# Patient Record
Sex: Female | Born: 2000 | Race: White | Hispanic: No | Marital: Single | Smoking: Never smoker
Health system: Southern US, Community
[De-identification: ages and names within clinical notes are randomized; demographics above are authoritative.]

## PROBLEM LIST (undated history)

## (undated) DIAGNOSIS — Z789 Other specified health status: Secondary | ICD-10-CM

## (undated) HISTORY — PX: NO PAST SURGERIES: SHX2092

## (undated) NOTE — *Deleted (*Deleted)
I value your feedback and entrusting us with your care. If you get a [facility] patient survey, I would appreciate you taking the time to let us know about your experience today. Thank you!  As of July 06, 2019, your lab results will be released to your MyChart immediately, before I even have a chance to see them. Please give me time to review them and contact you if there are any abnormalities. Thank you for your patience.  

---
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
## 2019-03-29 ENCOUNTER — Other Ambulatory Visit: Payer: Self-pay | Admitting: Ophthalmology

## 2019-03-29 DIAGNOSIS — I671 Cerebral aneurysm, nonruptured: Secondary | ICD-10-CM

## 2019-04-10 ENCOUNTER — Ambulatory Visit
Admission: RE | Admit: 2019-04-10 | Discharge: 2019-04-10 | Disposition: A | Discharge status: Home / Self Care | Payer: BLUE CROSS/BLUE SHIELD | Source: Ambulatory Visit | Attending: Ophthalmology | Admitting: Ophthalmology

## 2019-04-10 ENCOUNTER — Other Ambulatory Visit: Payer: Self-pay

## 2019-04-10 DIAGNOSIS — I671 Cerebral aneurysm, nonruptured: Secondary | ICD-10-CM | POA: Diagnosis not present

## 2019-04-10 MED ORDER — GADOBUTROL 1 MMOL/ML IV SOLN
6.0000 mL | Freq: Once | INTRAVENOUS | Status: AC | PRN
  Administered 2019-04-10: 13:00:00 6 mL via INTRAVENOUS

## 2019-04-13 ENCOUNTER — Other Ambulatory Visit: Payer: Self-pay

## 2019-04-13 DIAGNOSIS — Z20822 Contact with and (suspected) exposure to covid-19: Secondary | ICD-10-CM

## 2019-04-14 LAB — NOVEL CORONAVIRUS, NAA: SARS-CoV-2, NAA: NOT DETECTED

## 2019-04-20 ENCOUNTER — Other Ambulatory Visit: Payer: Self-pay

## 2019-04-20 DIAGNOSIS — Z20822 Contact with and (suspected) exposure to covid-19: Secondary | ICD-10-CM

## 2019-04-21 LAB — NOVEL CORONAVIRUS, NAA: SARS-CoV-2, NAA: NOT DETECTED

## 2019-05-02 ENCOUNTER — Other Ambulatory Visit
Admission: RE | Admit: 2019-05-02 | Discharge: 2019-05-02 | Disposition: A | Discharge status: Home / Self Care | Payer: BLUE CROSS/BLUE SHIELD | Source: Ambulatory Visit | Attending: Ophthalmology | Admitting: Ophthalmology

## 2019-05-02 ENCOUNTER — Other Ambulatory Visit: Payer: Self-pay | Admitting: Ophthalmology

## 2019-05-02 DIAGNOSIS — H348122 Central retinal vein occlusion, left eye, stable: Secondary | ICD-10-CM

## 2019-05-02 LAB — ANTITHROMBIN III: AntiThromb III Func: 104 % (ref 75–120)

## 2019-05-03 LAB — MISC LABCORP TEST (SEND OUT)
Labcorp test code: 86264
Labcorp test code: 117892

## 2019-05-03 LAB — PROTEIN S ACTIVITY: Protein S Activity: 76 % (ref 63–140)

## 2019-05-03 LAB — PROTEIN C ACTIVITY: Protein C Activity: 110 % (ref 73–180)

## 2019-05-03 LAB — HOMOCYSTEINE: Homocysteine: 6.1 umol/L (ref 0.0–14.5)

## 2019-05-04 ENCOUNTER — Encounter: Payer: Self-pay | Admitting: Advanced Practice Midwife

## 2019-05-04 ENCOUNTER — Ambulatory Visit (INDEPENDENT_AMBULATORY_CARE_PROVIDER_SITE_OTHER): Payer: BLUE CROSS/BLUE SHIELD | Admitting: Advanced Practice Midwife

## 2019-05-04 ENCOUNTER — Other Ambulatory Visit: Payer: Self-pay

## 2019-05-04 VITALS — BP 121/84 | HR 94 | Ht 68.0 in | Wt 152.0 lb

## 2019-05-04 DIAGNOSIS — Z3009 Encounter for other general counseling and advice on contraception: Secondary | ICD-10-CM | POA: Diagnosis not present

## 2019-05-04 MED ORDER — NORETHIN ACE-ETH ESTRAD-FE 1-20 MG-MCG PO TABS: 1.0000 | ORAL_TABLET | Freq: Every day | ORAL | 4 refills | Status: DC

## 2019-05-04 MED ORDER — NORETHIN ACE-ETH ESTRAD-FE 1-20 MG-MCG PO TABS: 1.0000 | ORAL_TABLET | Freq: Every day | ORAL | 11 refills | Status: DC

## 2019-05-04 NOTE — Progress Notes (Signed)
Patient ID: Kim Sandoval, female   DOB: 25-May-2001, 18 y.o.   MRN: [id number]  Reason for Consult: Advice Only (Discuss birthcontrol options)   Referred by Paulina Fusi, MD  Subjective:     HPI:   Kim Sandoval is a 18 y.o. female in the office to discuss birth control options. She has only used condoms and Plan B in the past. She admits monthly stress regarding the possibility of getting pregnant and would like to start hormonal birth control.  The patient is in school at [employer]. 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 She admits healthy lifestyle: diet, hydration, sleep. She is a non-smoker. She takes a multi vitamin and Claritin for allergies. She has had Gardasil vaccine x3. She does not have concern for STDs and declines testing.   Her periods are normally every month, last for 5-7 days and are of moderate flow.   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 So far the results have been normal. She does have headaches but does not know of any triggers- she denies menstrual headaches. She wears contact lenses/glasses. She denies a family history of bleeding disorders.  We discussed all of the birth control options in detail. She is most familiar with and comfortable taking the daily pill. Reviewed the importance of taking it every day around the same time, adjustment time for new hormones, and effectiveness lessens when taking antibiotics. We also discussed beginning PAP smears when she is 18 years old.   Past Medical History:  Diagnosis Date  . No pertinent past medical history    Family History  Problem Relation Age of Onset  . Asthma Brother    Past Surgical History:  Procedure Laterality Date  . NO PAST SURGERIES      Short Social History:  Social History   Tobacco Use  . Smoking status: Never Smoker  . Smokeless tobacco: Never Used  Substance Use Topics  . Alcohol use: Yes    Comment: occ     No Known Allergies  Current Outpatient Medications  Medication Sig Dispense Refill  . norethindrone-ethinyl estradiol (JUNEL FE 1/20) 1-20 MG-MCG tablet Take 1 tablet by mouth daily. 1 Package 11   No current facility-administered medications for this visit.     Review of Systems  Constitutional:  Constitutional negative. HENT: HENT negative.  Eyes: Positive for visual disturbance.   Respiratory: Respiratory negative.  Cardiovascular: Cardiovascular negative.  GI: Gastrointestinal negative.  GU: Genitourinary negative. Musculoskeletal: Musculoskeletal negative.  Skin: Skin negative.  Neurological: Neurological negative. Hematologic: Hematologic/lymphatic negative.  Psychiatric: Psychiatric negative.        Objective:  Objective   Vitals:   05/04/19 0827  BP: 121/84  Pulse: 94  Weight: 152 lb (68.9 kg)  Height: 5\' 8"  (1.727 m)   Body mass index is 23.11 kg/m. Constitutional: Well nourished, well developed female in no acute distress.  HEENT: normal Skin: Warm and dry.  Cardiovascular: Regular rate and rhythm.   Extremity: no edema  Respiratory:  Normal respiratory effort Neuro: DTRs 2+, Cranial nerves grossly intact Psych: Alert and Oriented x3. No memory deficits. Normal mood and affect.  MS: normal gait, normal bilateral lower extremity ROM/strength/stability.       Assessment/Plan:     18 year old G0 P0 female desiring birth control  Rx Junel Follow up PRN   [facility] 05/04/2019,  9:46 AM

## 2019-05-04 NOTE — Patient Instructions (Signed)
Bedsider.org is a good resource for information about all methods of birth control.

## 2019-05-05 ENCOUNTER — Ambulatory Visit
Admission: RE | Admit: 2019-05-05 | Discharge: 2019-05-05 | Disposition: A | Discharge status: Home / Self Care | Payer: BLUE CROSS/BLUE SHIELD | Source: Ambulatory Visit | Attending: Ophthalmology | Admitting: Ophthalmology

## 2019-05-05 ENCOUNTER — Other Ambulatory Visit: Payer: Self-pay

## 2019-05-05 DIAGNOSIS — H348122 Central retinal vein occlusion, left eye, stable: Secondary | ICD-10-CM

## 2019-05-08 LAB — FACTOR 5 LEIDEN

## 2019-05-08 LAB — PROTHROMBIN GENE MUTATION

## 2019-05-19 ENCOUNTER — Other Ambulatory Visit
Admission: RE | Admit: 2019-05-19 | Discharge: 2019-05-19 | Disposition: A | Discharge status: Home / Self Care | Payer: BLUE CROSS/BLUE SHIELD | Source: Ambulatory Visit | Attending: Ophthalmology | Admitting: Ophthalmology

## 2019-05-19 DIAGNOSIS — H35062 Retinal vasculitis, left eye: Secondary | ICD-10-CM | POA: Diagnosis present

## 2019-05-19 DIAGNOSIS — H3582 Retinal ischemia: Secondary | ICD-10-CM | POA: Insufficient documentation

## 2019-05-19 LAB — SEDIMENTATION RATE: Sed Rate: 8 mm/hr (ref 0–20)

## 2019-05-20 LAB — ANA: Anti Nuclear Antibody (ANA): NEGATIVE

## 2019-05-20 LAB — RHEUMATOID FACTOR: Rheumatoid fact SerPl-aCnc: <10 IU/mL (ref 0.0–13.9)

## 2019-05-20 LAB — RPR: RPR Ser Ql: NONREACTIVE

## 2019-05-21 LAB — ANGIOTENSIN CONVERTING ENZYME: Angiotensin-Converting Enzyme: 28 U/L (ref 14–82)

## 2019-05-22 LAB — TOXOPLASMA GONDII ANTIBODY, IGG: Toxoplasma IgG Ratio: <3 IU/mL (ref 0.0–7.1)

## 2019-05-22 LAB — PAN-ANCA
ANCA Proteinase 3: <3.5 U/mL (ref 0.0–3.5)
Atypical P-ANCA titer: <1:20 {titer}
C-ANCA: <1:20 {titer}
Myeloperoxidase Abs: <9 U/mL (ref 0.0–9.0)
P-ANCA: <1:20 {titer}

## 2019-05-23 LAB — QUANTIFERON-TB GOLD PLUS: QuantiFERON-TB Gold Plus: NEGATIVE

## 2019-05-23 LAB — QUANTIFERON-TB GOLD PLUS (RQFGPL)
QuantiFERON Mitogen Value: >10 IU/mL
QuantiFERON Nil Value: 0.02 IU/mL
QuantiFERON TB1 Ag Value: 0.02 IU/mL
QuantiFERON TB2 Ag Value: 0.02 IU/mL

## 2019-05-26 ENCOUNTER — Other Ambulatory Visit: Payer: Self-pay

## 2019-05-26 DIAGNOSIS — Z20822 Contact with and (suspected) exposure to covid-19: Secondary | ICD-10-CM

## 2019-05-28 LAB — NOVEL CORONAVIRUS, NAA: SARS-CoV-2, NAA: NOT DETECTED

## 2019-07-28 DIAGNOSIS — H47019 Ischemic optic neuropathy, unspecified eye: Secondary | ICD-10-CM

## 2019-07-28 HISTORY — DX: Ischemic optic neuropathy, unspecified eye: H47.019

## 2019-07-31 ENCOUNTER — Other Ambulatory Visit: Payer: Self-pay | Admitting: Family Medicine

## 2019-07-31 DIAGNOSIS — Z8616 Personal history of COVID-19: Secondary | ICD-10-CM

## 2019-07-31 DIAGNOSIS — U071 COVID-19: Secondary | ICD-10-CM

## 2019-07-31 DIAGNOSIS — I400 Infective myocarditis: Secondary | ICD-10-CM

## 2019-08-08 ENCOUNTER — Ambulatory Visit (INDEPENDENT_AMBULATORY_CARE_PROVIDER_SITE_OTHER): Payer: BLUE CROSS/BLUE SHIELD

## 2019-08-08 ENCOUNTER — Other Ambulatory Visit: Payer: Self-pay

## 2019-08-08 ENCOUNTER — Other Ambulatory Visit
Admission: RE | Admit: 2019-08-08 | Discharge: 2019-08-08 | Disposition: A | Discharge status: Home / Self Care | Payer: BLUE CROSS/BLUE SHIELD | Source: Ambulatory Visit | Attending: Family Medicine | Admitting: Family Medicine

## 2019-08-08 DIAGNOSIS — U071 COVID-19: Secondary | ICD-10-CM

## 2019-08-08 DIAGNOSIS — Z8616 Personal history of COVID-19: Secondary | ICD-10-CM

## 2019-08-08 DIAGNOSIS — I400 Infective myocarditis: Secondary | ICD-10-CM

## 2019-08-08 LAB — TROPONIN I (HIGH SENSITIVITY): Troponin I (High Sensitivity): <2 ng/L (ref <18)

## 2019-09-05 DIAGNOSIS — H47012 Ischemic optic neuropathy, left eye: Secondary | ICD-10-CM | POA: Insufficient documentation

## 2019-09-05 DIAGNOSIS — G43019 Migraine without aura, intractable, without status migrainosus: Secondary | ICD-10-CM | POA: Insufficient documentation

## 2019-11-09 ENCOUNTER — Other Ambulatory Visit: Payer: Self-pay

## 2019-11-09 ENCOUNTER — Ambulatory Visit: Payer: Self-pay | Admitting: Physician Assistant

## 2019-11-09 ENCOUNTER — Encounter: Payer: Self-pay | Admitting: Physician Assistant

## 2019-11-09 DIAGNOSIS — Z113 Encounter for screening for infections with a predominantly sexual mode of transmission: Secondary | ICD-10-CM

## 2019-11-09 LAB — WET PREP FOR TRICH, YEAST, CLUE
Trichomonas Exam: NEGATIVE
Yeast Exam: NEGATIVE

## 2019-11-09 NOTE — Progress Notes (Signed)
Patient here for STD testing today. Taking OC for BCM.Marland KitchenBurt Knack, RN

## 2019-11-09 NOTE — Progress Notes (Signed)
Wet mount reviewed, no treatment indicated..Insiya Oshea Brewer-Jensen, RN 

## 2019-11-09 NOTE — Progress Notes (Signed)
  [facility] Department STI clinic/screening visit  Subjective:  Kim Sandoval is a 19 y.o. female being seen today for an STI screening visit. The patient reports they do have symptoms.  Patient reports that they do not desire a pregnancy in the next year.   They reported they are not interested in discussing contraception today.  Patient's last menstrual period was 10/17/2019 (exact date).   Patient has the following medical conditions:  There are no problems to display for this patient.   Chief Complaint  Patient presents with  . Exposure to STD    19 yo woman here for STI testing. On OCPs for contraception.   Patient reports she went [facility] due to urinary freq and just started on an oral antibiotic she does not know the name of. She has had 2 doses.  See flowsheet for further details and programmatic requirements.    The following portions of the patient's history were reviewed and updated as appropriate: allergies, current medications, past medical history, past social history, past surgical history and problem list.  Objective:  There were no vitals filed for this visit.  Physical Exam Constitutional:      Appearance: Normal appearance.  HENT:     Mouth/Throat:     Mouth: Mucous membranes are moist.     Pharynx: Oropharynx is clear. No oropharyngeal exudate or posterior oropharyngeal erythema.  Neck:     Vascular: No carotid bruit.  Pulmonary:     Effort: Pulmonary effort is normal.  Abdominal:     Palpations: Abdomen is soft.     Tenderness: There is no abdominal tenderness. There is no guarding.  Musculoskeletal:     Cervical back: Normal range of motion.  Lymphadenopathy:     Cervical: No cervical adenopathy.  Skin:    General: Skin is warm and dry.     Findings: No erythema.  Neurological:     Mental Status: She is alert and oriented to person, place, and time.  Psychiatric:        Behavior: Behavior normal.      Thought Content: Thought content normal.        Judgment: Judgment normal.      Assessment and Plan:  Kim Sandoval is a 19 y.o. female presenting to the [facility] Department for STI screening  1. Routine screening for STI (sexually transmitted infection) Treat wet prep prn per standing orders. - HIV [place] [facility] - Syphilis Serology, [facility] - Chlamydia/Gonorrhea  Lab - WET PREP FOR TRICH, YEAST, CLUE - Gonococcus culture - Gonococcus culture     Return if symptoms worsen or fail to improve, or for repeat STI screening..  No future appointments.  Landry Dyke, PA-C

## 2019-11-14 LAB — GONOCOCCUS CULTURE

## 2019-12-08 ENCOUNTER — Encounter: Payer: Self-pay | Admitting: Advanced Practice Midwife

## 2019-12-08 ENCOUNTER — Ambulatory Visit (INDEPENDENT_AMBULATORY_CARE_PROVIDER_SITE_OTHER): Payer: BLUE CROSS/BLUE SHIELD | Admitting: Advanced Practice Midwife

## 2019-12-08 ENCOUNTER — Other Ambulatory Visit: Payer: Self-pay

## 2019-12-08 VITALS — BP 100/60 | Ht 68.0 in | Wt 150.0 lb

## 2019-12-08 DIAGNOSIS — Z30016 Encounter for initial prescription of transdermal patch hormonal contraceptive device: Secondary | ICD-10-CM

## 2019-12-08 MED ORDER — NORELGESTROMIN-ETH ESTRADIOL 150-35 MCG/24HR TD PTWK: 1.0000 | MEDICATED_PATCH | TRANSDERMAL | 4 refills | Status: DC

## 2019-12-11 ENCOUNTER — Encounter: Payer: Self-pay | Admitting: Advanced Practice Midwife

## 2019-12-11 NOTE — Progress Notes (Signed)
   Patient ID: Kim Sandoval, female   DOB: 2001/05/02, 19 y.o.   MRN: [id number]  Reason for Consult: Contraception   Subjective:  Date of Service: 12/08/2019  HPI:  Kim Sandoval is a 19 y.o. female being seen because she wants to change birth control method. She has been on Junel OCP since last October when birth control was initiated. At this time she would like to switch to a non-daily method of control. She is most interested in the patch and understands it must be changed weekly for ideal use. She understands there is an adjustment time when changing hormones. She is due for an annual in 1 year.  She is a non-smoker and has no other concerns at this time.   Past Medical History:  Diagnosis Date  . No pertinent past medical history    Family History  Problem Relation Age of Onset  . Asthma Brother    Past Surgical History:  Procedure Laterality Date  . NO PAST SURGERIES      Short Social History:  Social History   Tobacco Use  . Smoking status: Never Smoker  . Smokeless tobacco: Never Used  Substance Use Topics  . Alcohol use: Yes    Comment: occ    Allergies  Allergen Reactions  . Penicillins Other (See Comments)    Unsure reaction  . Latex Rash    Current Outpatient Medications  Medication Sig Dispense Refill  . norelgestromin-ethinyl estradiol (ORTHO EVRA) 150-35 MCG/24HR transdermal patch Place 1 patch onto the skin once a week. 9 patch 4   No current facility-administered medications for this visit.    Review of Systems  Constitutional: Negative for chills and fever.  HENT: Negative for congestion, ear discharge, ear pain, hearing loss, sinus pain and sore throat.   Eyes: Negative for blurred vision and double vision.  Respiratory: Negative for cough, shortness of breath and wheezing.   Cardiovascular: Negative for chest pain, palpitations and leg swelling.  Gastrointestinal: Negative for abdominal pain, blood in stool, constipation, diarrhea,  heartburn, melena, nausea and vomiting.  Genitourinary: Negative for dysuria, flank pain, frequency, hematuria and urgency.  Musculoskeletal: Negative for back pain, joint pain and myalgias.  Skin: Negative for itching and rash.  Neurological: Negative for dizziness, tingling, tremors, sensory change, speech change, focal weakness, seizures, loss of consciousness, weakness and headaches.  Endo/Heme/Allergies: Negative for environmental allergies. Does not bruise/bleed easily.  Psychiatric/Behavioral: Negative for depression, hallucinations, memory loss, substance abuse and suicidal ideas. The patient is not nervous/anxious and does not have insomnia.        Objective:  Objective   Vitals:   12/08/19 1543  BP: 100/60  Weight: 150 lb (68 kg)  Height: 5\' 8"  (1.727 m)   Body mass index is 22.81 kg/m. Constitutional: Well nourished, well developed female in no acute distress.  HEENT: normal Skin: Warm and dry.  Cardiovascular: Regular rate and rhythm.   Extremity: no edema  Respiratory: Clear to auscultation bilateral. Normal respiratory effort Neuro: DTRs 2+, Cranial nerves grossly intact Psych: Alert and Oriented x3. No memory deficits. Normal mood and affect.  MS: normal gait, normal strength and range of motion    Assessment/Plan:   19 y.o. female with change of birth control method  Discontinue Junel OCP Rx Ortho-Evra weekly birth control patch Return to clinic PRN and for annual exam in 1 year   12 CNM [facility]

## 2020-01-03 NOTE — Telephone Encounter (Signed)
Sergio returned call. She is currently in [place] and was seen by a provider there for yeast infection and given Diflucan to take every 3 days x 2 doses. The external swelling has resolved, still having a little itching. Advised can get Lotrimin over the counter for external use or call her provider in [place] for a RX. Was asking why the yeast infection developed. Has not been on any antibiotics or different soap or had any different laundry products. May just be the heat, sweating etc. To check partner for any symptoms of fungal infection.  Farrel Conners, CNM

## 2020-04-09 ENCOUNTER — Other Ambulatory Visit: Payer: Self-pay

## 2020-04-09 DIAGNOSIS — Z30016 Encounter for initial prescription of transdermal patch hormonal contraceptive device: Secondary | ICD-10-CM

## 2020-04-09 MED ORDER — NORELGESTROMIN-ETH ESTRADIOL 150-35 MCG/24HR TD PTWK: 1.0000 | MEDICATED_PATCH | TRANSDERMAL | 4 refills | Status: DC

## 2020-04-22 ENCOUNTER — Ambulatory Visit: Payer: BLUE CROSS/BLUE SHIELD | Admitting: Advanced Practice Midwife

## 2020-04-26 ENCOUNTER — Ambulatory Visit (INDEPENDENT_AMBULATORY_CARE_PROVIDER_SITE_OTHER): Payer: BLUE CROSS/BLUE SHIELD | Admitting: Advanced Practice Midwife

## 2020-04-26 ENCOUNTER — Other Ambulatory Visit: Payer: Self-pay

## 2020-04-26 ENCOUNTER — Encounter: Payer: Self-pay | Admitting: Advanced Practice Midwife

## 2020-04-26 VITALS — BP 120/60 | Ht 67.0 in | Wt 156.0 lb

## 2020-04-26 DIAGNOSIS — Z3009 Encounter for other general counseling and advice on contraception: Secondary | ICD-10-CM | POA: Diagnosis not present

## 2020-04-29 NOTE — Progress Notes (Signed)
   Patient ID: Kim Sandoval, female   DOB: 2001-01-30, 19 y.o.   MRN: [id number]  Reason for Consult: Contraception (interested in non-hormonal BC)   Referred by Dr Micael Hampshire, [facility]  Subjective:  Date of Service: 04/26/2020  HPI:   Kim Sandoval is a 19 y.o. female the patient is being seen on the advice of her Opthalmologist at [facility] regarding switching to non-hormonal birth control. 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 Her doctor would like the patient to reduce her risk of blood clots by switching to non-estrogen birth control. The patient has done some research on Paragard IUD and is most interested in that option. We discussed all progesterone only options and non-hormonal options. She has no other concerns at this time.  Past Medical History:  Diagnosis Date  . Ischemic optic neuropathy 2021  . No pertinent past medical history    Family History  Problem Relation Age of Onset  . Asthma Brother    Past Surgical History:  Procedure Laterality Date  . NO PAST SURGERIES      Short Social History:  Social History   Tobacco Use  . Smoking status: Never Smoker  . Smokeless tobacco: Never Used  Substance Use Topics  . Alcohol use: Yes    Comment: occ    Allergies  Allergen Reactions  . Latex Rash    Current Outpatient Medications  Medication Sig Dispense Refill  . norelgestromin-ethinyl estradiol (ORTHO EVRA) 150-35 MCG/24HR transdermal patch Place 1 patch onto the skin once a week. 9 patch 4   No current facility-administered medications for this visit.    Review of Systems  Constitutional: Negative for chills and fever.  HENT: Negative for congestion, ear discharge, ear pain, hearing loss, sinus pain and sore throat.   Eyes: Negative for blurred vision and double vision.  Respiratory: Negative for cough, shortness of breath and wheezing.   Cardiovascular: Negative for chest pain, palpitations and leg  swelling.  Gastrointestinal: Negative for abdominal pain, blood in stool, constipation, diarrhea, heartburn, melena, nausea and vomiting.  Genitourinary: Negative for dysuria, flank pain, frequency, hematuria and urgency.  Musculoskeletal: Negative for back pain, joint pain and myalgias.  Skin: Negative for itching and rash.  Neurological: Negative for dizziness, tingling, tremors, sensory change, speech change, focal weakness, seizures, loss of consciousness, weakness and headaches.  Endo/Heme/Allergies: Negative for environmental allergies. Does not bruise/bleed easily.  Psychiatric/Behavioral: Negative for depression, hallucinations, memory loss, substance abuse and suicidal ideas. The patient is not nervous/anxious and does not have insomnia.         Objective:  Objective   Vitals:   04/26/20 1622  BP: 120/60  Weight: 156 lb (70.8 kg)  Height: 5\' 7"  (1.702 m)   Body mass index is 24.43 kg/m.  Constitutional: Well nourished, well developed female in no acute distress.  HEENT: normal Skin: Warm and dry.  Respiratory: Normal respiratory effort Neuro: DTRs 2+, Cranial nerves grossly intact Psych: Alert and Oriented x3. No memory deficits. Normal mood and affect.  MS: normal gait, normal bilateral lower extremity ROM/strength/stability.  Limited physical exam, purpose of visit is primarily counseling   Assessment/Plan:     19 y.o. G0 P0 female with birth control counseling visit  Call when on period for Paragard Copper IUD insertion appointment   26 CNM [facility] 04/29/2020, 5:51 PM

## 2020-05-01 ENCOUNTER — Telehealth: Payer: Self-pay | Admitting: Obstetrics and Gynecology

## 2020-05-01 NOTE — Patient Instructions (Signed)
I value your feedback and entrusting us with your care. If you get a [facility] patient survey, I would appreciate you taking the time to let us know about your experience today. Thank you!  As of July 06, 2019, your lab results will be released to your MyChart immediately, before I even have a chance to see them. Please give me time to review them and contact you if there are any abnormalities. Thank you for your patience.   [facility] [phone]  Instructions after IUD insertion  Most women experience no significant problems after insertion of an IUD, however minor cramping and spotting for a few days is common. Cramps may be treated with ibuprofen 800mg every 8 hours or Tylenol 650 mg every 4 hours. Contact [facility] immediately if you experience any of the following symptoms during the next week: temperature >99.6 degrees, worsening pelvic pain, abdominal pain, fainting, unusually heavy vaginal bleeding, foul vaginal discharge, or if you think you have expelled the IUD.  Nothing inserted in the vagina for 48 hours. You will be scheduled for a follow up visit in approximately four weeks.  You should check monthly to be sure you can feel the IUD strings in the upper vagina. If you are having a monthly period, try to check after each period. If you cannot feel the IUD strings,  contact [facility] immediately so we can do an exam to determine if the IUD has been expelled.   Please use backup protection until we can confirm the IUD is in place.  Call [facility] if you are exposed to or diagnosed with a sexually transmitted infection, as we will need to discuss whether it is safe for you to continue using an IUD.   

## 2020-05-01 NOTE — Telephone Encounter (Signed)
Patient is scheduled for 05/02/20 with ABC at 8:50 for paraguard placement

## 2020-05-01 NOTE — Progress Notes (Signed)
   Chief Complaint  Patient presents with  . Contraception    Paragard insertion     IUD PROCEDURE NOTE:  Kim Sandoval is a 19 y.o. 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 Ophthalmology prefers pt be in non-estrogen [facility]. She and Higinio Plan, CNM discussed options and pt interested in Paragard.  Neg STD testing 4/21, has new partner since  BP 104/70   Ht 5\' 7"  (1.702 m)   Wt 159 lb (72.1 kg)   LMP 05/01/2020   BMI 24.90 kg/m   IUD Insertion Procedure Note Patient identified, informed consent performed, consent signed.   Discussed risks of irregular bleeding, cramping, infection, malpositioning or misplacement of the IUD outside the uterus which may require further procedure such as laparoscopy, risk of failure <1%. Time out was performed.    Speculum placed in the vagina.  Cervix visualized.  Cleaned with Betadine x 2.  Grasped anteriorly with a single tooth tenaculum.  Uterus sounded to 7.5 cm.   IUD placed per manufacturer's recommendations.  Strings trimmed to 3 cm. Tenaculum was removed, good hemostasis noted.  Patient tolerated procedure well.   ASSESSMENT:  Encounter for IUD insertion - Plan: paragard intrauterine copper IUD  Screening for STD (sexually transmitted disease) - Plan: Cervicovaginal ancillary only   Meds ordered this encounter  Medications  . paragard intrauterine copper IUD     Plan:  Patient was given post-procedure instructions.  She was advised to have backup contraception for one week.   Call if you are having increasing pain, cramps or bleeding or if you have a fever greater than 100.4 degrees F., shaking chills, nausea or vomiting. Patient was also asked to check IUD strings periodically and follow up in 4 weeks for IUD check.  Return in about 4 weeks (around 05/30/2020) for IUD f/u.  Shaakira Borrero B. Autumne Kallio, PA-C 05/02/2020 9:12 AM

## 2020-05-02 ENCOUNTER — Other Ambulatory Visit: Payer: Self-pay

## 2020-05-02 ENCOUNTER — Other Ambulatory Visit (HOSPITAL_COMMUNITY)
Admission: RE | Admit: 2020-05-02 | Discharge: 2020-05-02 | Disposition: A | Discharge status: Home / Self Care | Payer: BLUE CROSS/BLUE SHIELD | Source: Ambulatory Visit | Attending: Obstetrics and Gynecology | Admitting: Obstetrics and Gynecology

## 2020-05-02 ENCOUNTER — Ambulatory Visit (INDEPENDENT_AMBULATORY_CARE_PROVIDER_SITE_OTHER): Payer: BLUE CROSS/BLUE SHIELD | Admitting: Obstetrics and Gynecology

## 2020-05-02 ENCOUNTER — Encounter: Payer: Self-pay | Admitting: Obstetrics and Gynecology

## 2020-05-02 VITALS — BP 104/70 | Ht 67.0 in | Wt 159.0 lb

## 2020-05-02 DIAGNOSIS — Z30430 Encounter for insertion of intrauterine contraceptive device: Secondary | ICD-10-CM

## 2020-05-02 DIAGNOSIS — Z113 Encounter for screening for infections with a predominantly sexual mode of transmission: Secondary | ICD-10-CM

## 2020-05-02 MED ORDER — PARAGARD INTRAUTERINE COPPER IU IUD: INTRAUTERINE_SYSTEM | Freq: Once | INTRAUTERINE | Status: AC

## 2020-05-02 NOTE — Telephone Encounter (Signed)
Noted. Paragard provided for this patient.

## 2020-05-03 LAB — CERVICOVAGINAL ANCILLARY ONLY
Chlamydia: NEGATIVE
Comment: NEGATIVE
Comment: NORMAL
Neisseria Gonorrhea: NEGATIVE

## 2020-05-29 NOTE — Progress Notes (Deleted)
   No chief complaint on file.    History of Present Illness:  Kim Sandoval is a 19 y.o. that had a Paragard IUD placed approximately 1 month ago. Since that time, she denies dyspareunia, pelvic pain, non-menstrual bleeding, vaginal d/c, heavy bleeding.   Review of Systems  Physical Exam:  LMP 05/01/2020  There is no height or weight on file to calculate BMI.  Pelvic exam:  Two IUD strings {DESC; PRESENT/ABSENT:17923::"present"} seen coming from the cervical os. EGBUS, vaginal vault and cervix: within normal limits   Assessment:   No diagnosis found.  IUD strings present in proper location; pt doing well  Plan: F/u if any signs of infection or can no longer feel the strings.   Jigar Zielke B. Conni Knighton, PA-C 05/29/2020 4:57 PM

## 2020-05-30 ENCOUNTER — Ambulatory Visit: Payer: BLUE CROSS/BLUE SHIELD | Admitting: Obstetrics and Gynecology

## 2020-05-30 DIAGNOSIS — Z30431 Encounter for routine checking of intrauterine contraceptive device: Secondary | ICD-10-CM

## 2020-06-06 IMAGING — MR MR HEAD WO/W CM
14 series · 43 of 48 positions shown · IV contrast (6ml Gadavist)
Comparison: None.

CLINICAL DATA: Carotid-cavernous fistula. Visual problems since the
end [DATE]. History of head trauma with concussion 2-3 years ago.

EXAM:
MRI HEAD AND ORBITS WITHOUT AND WITH CONTRAST
TECHNIQUE: Multiplanar, multiecho pulse sequences of the brain and surrounding
structures were obtained without and with intravenous contrast.
Multiplanar, multiecho pulse sequences of the orbits and surrounding
structures were obtained including fat saturation techniques, before
and after intravenous contrast administration.
CONTRAST:  6mL GADAVIST GADOBUTROL 1 MMOL/ML IV SOLN

[Series 5: T1 · sagittal · 3.0mm · 0.38mm/px · 1 of 35 slices shown (1 of 2)]
[im 1/35]
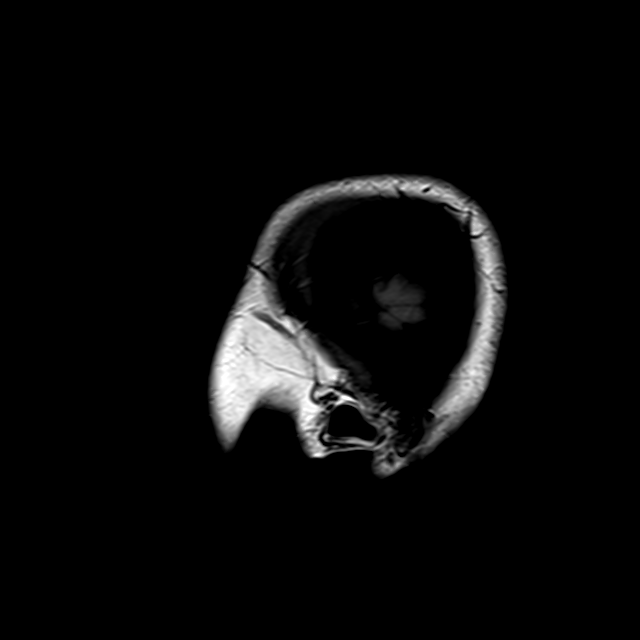

[Series 6: T2 · axial · 5.0mm · 0.53mm/px · 1 of 25 slices shown]
[im 1/25]
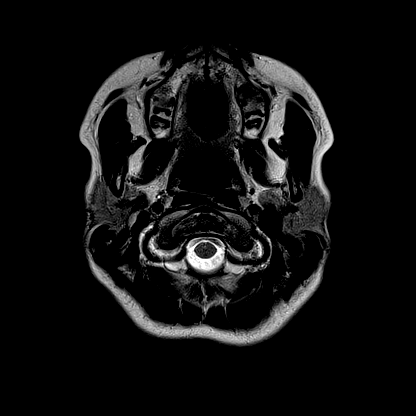

[Series 7: ax dwi_tracew · axial · 3.0mm · 0.60mm/px · z∈[-102,+51]mm · 3 of 48 slices shown]
[im 1/48]
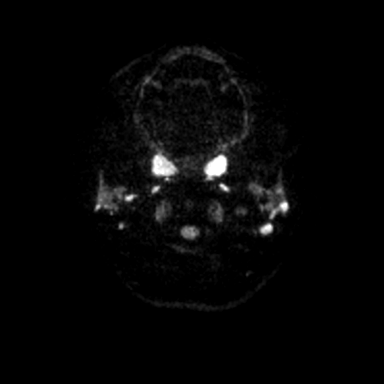
[im 24/48]
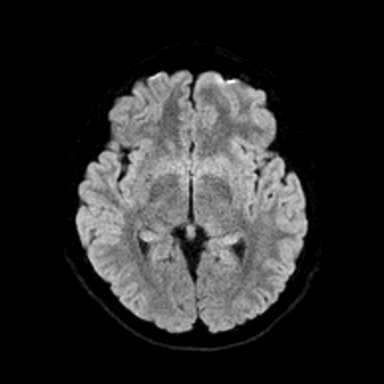
[im 48/48]
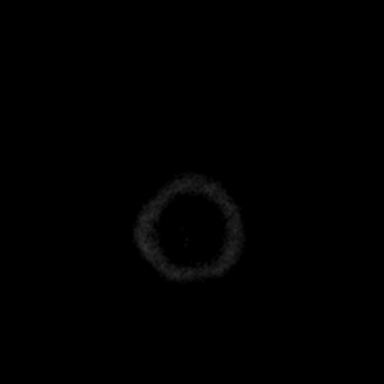

[Series 8: ax dwi_adc · axial · 3.0mm · 0.60mm/px · z∈[-102,+51]mm · 3 of 48 slices shown]
[im 1/48]
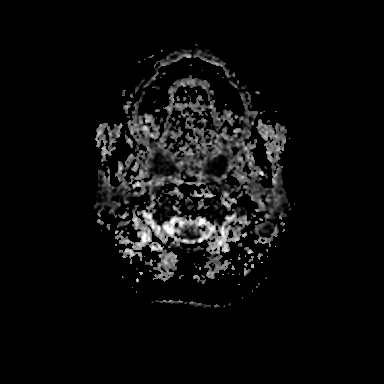
[im 24/48]
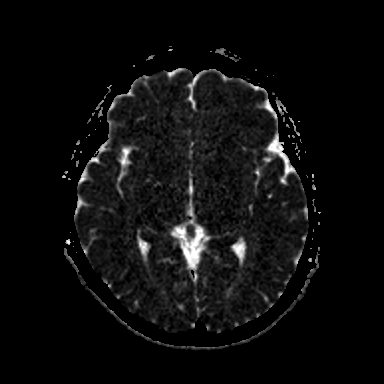
[im 48/48]
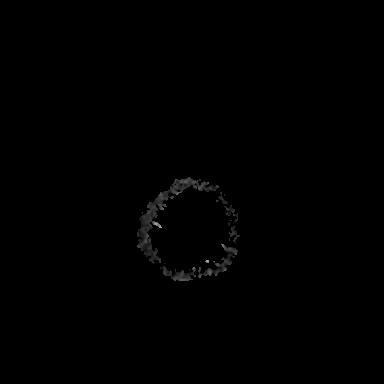

[Series 9: cor dwi_tracew · coronal · 5.0mm · 0.60mm/px · 2 of 38 slices shown]
[im 1/38]
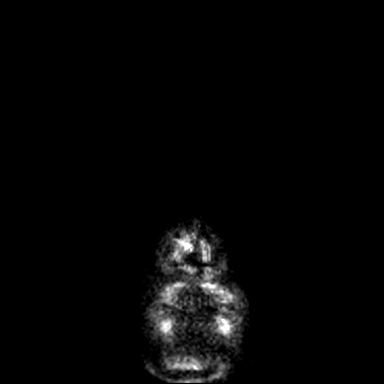
[im 38/38]
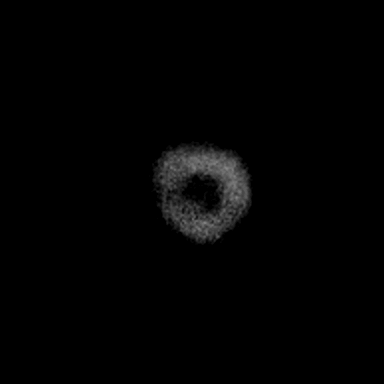

[Series 10: cor dwi_adc · coronal · 5.0mm · 0.60mm/px · 2 of 38 slices shown]
[im 1/38]
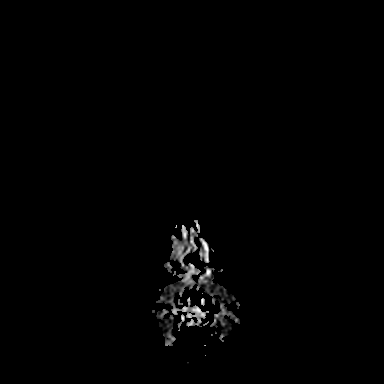
[im 38/38]
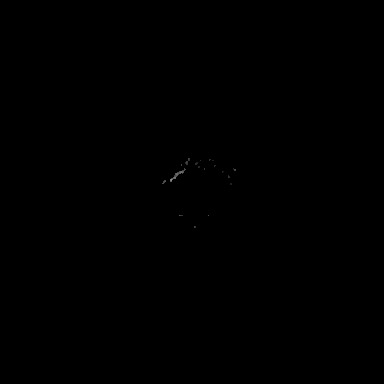

[Series 11: mag_images · axial · 3.0mm · 0.90mm/px · z∈[-110,+64]mm · 3 of 60 slices shown]
[im 1/60]
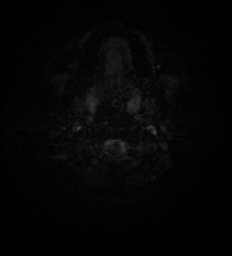
[im 30/60]
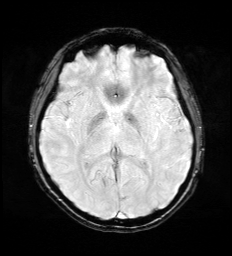
[im 60/60]
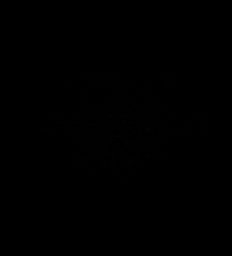

[Series 12: pha_images · axial · 3.0mm · 0.90mm/px · z∈[-110,+64]mm · 3 of 59 slices shown]
[im 1/59]
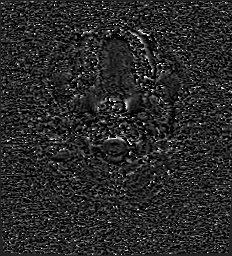
[im 30/59]
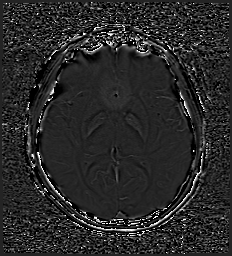
[im 59/59]
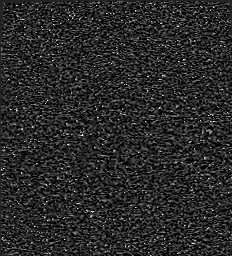

[Series 13: swi_images · axial · 3.0mm · 0.90mm/px · z∈[-110,-25]mm · 2 of 60 slices shown]
[im 1/60]
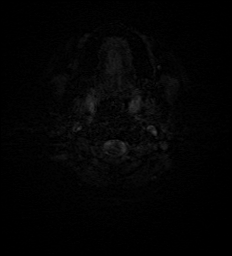
[im 30/60]
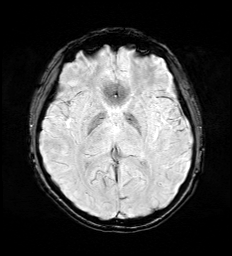

[Series 15: FLAIR · axial · 3.0mm · 0.53mm/px · z∈[-102,+57]mm · 3 of 55 slices shown]
[im 1/55]
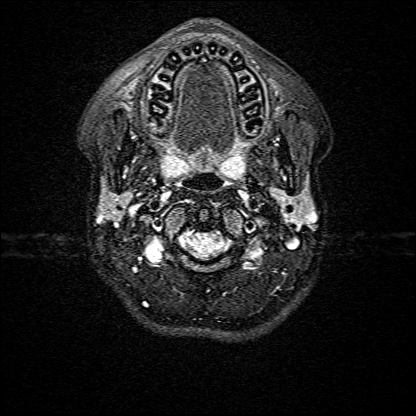
[im 28/55]
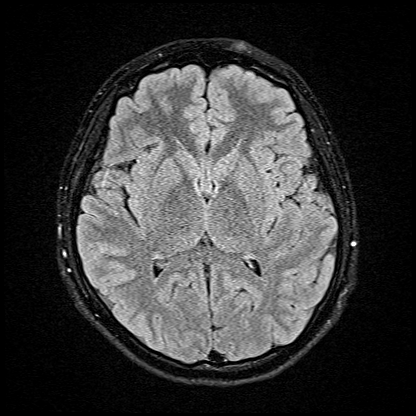
[im 55/55]
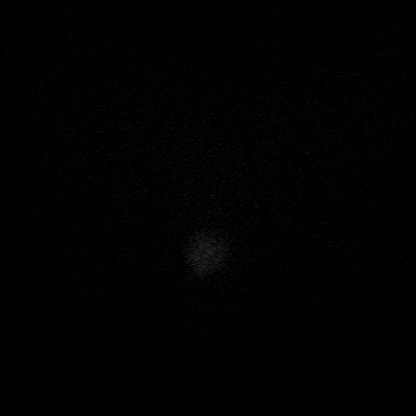

[Series 16: T1 · axial · 1.0mm · 0.98mm/px · z∈[-112,+60]mm · 8 of 176 slices shown (2 of 2)]
[im 1/176]
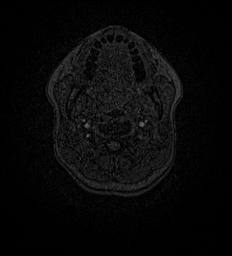
[im 20/176]
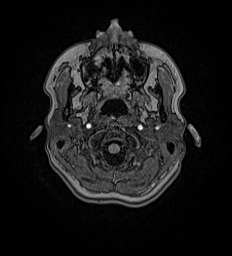
[im 59/176]
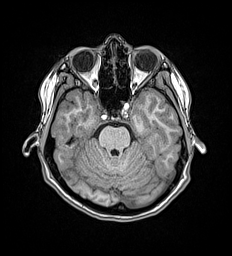
[im 78/176]
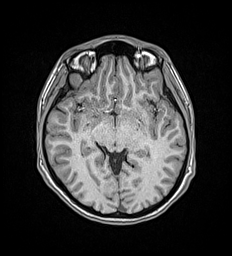
[im 98/176]
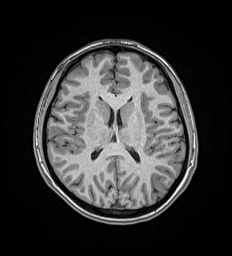
[im 117/176]
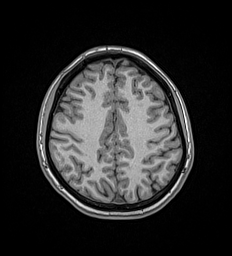
[im 156/176]
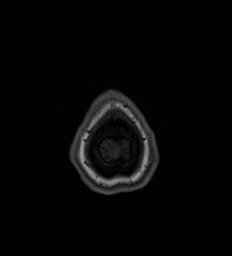
[im 176/176]
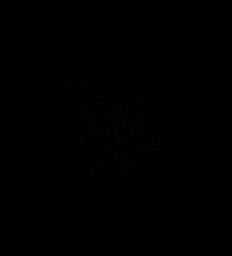

[Series 17: T2 post-contrast · coronal · 5.0mm · 0.57mm/px · 2 of 29 slices shown]
[im 1/29]
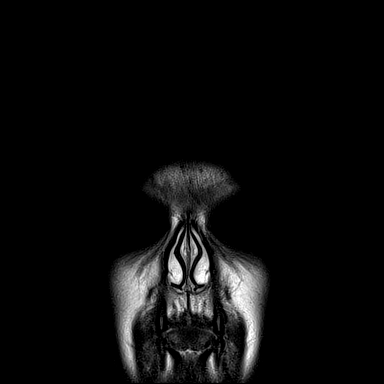
[im 29/29]
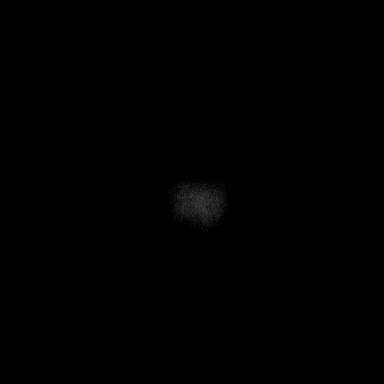

[Series 18: T1 post-contrast · axial · 1.0mm · 0.98mm/px · z∈[-112,+60]mm · 8 of 176 slices shown (1 of 2)]
[im 1/176]
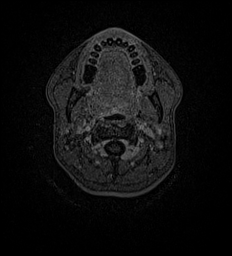
[im 20/176]
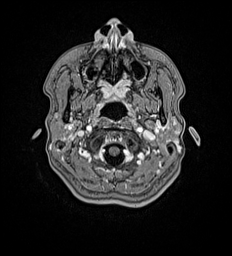
[im 59/176]
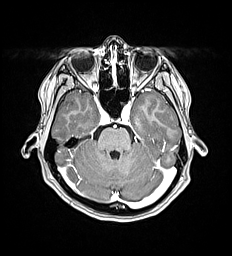
[im 78/176]
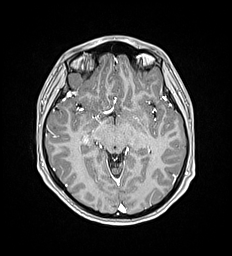
[im 98/176]
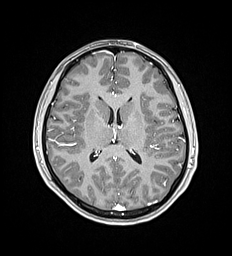
[im 117/176]
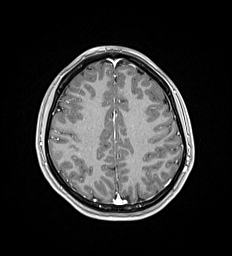
[im 156/176]
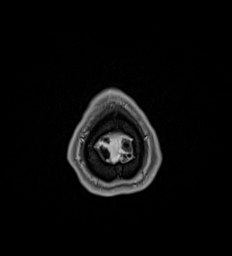
[im 176/176]
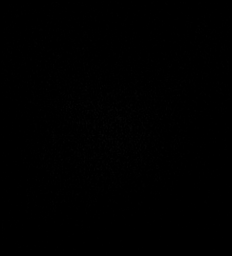

[Series 19: T1 post-contrast · coronal · 5.0mm · 0.57mm/px · 2 of 29 slices shown (2 of 2)]
[im 1/29]
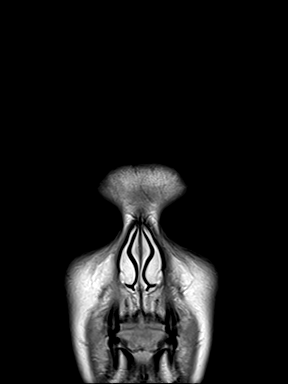
[im 29/29]
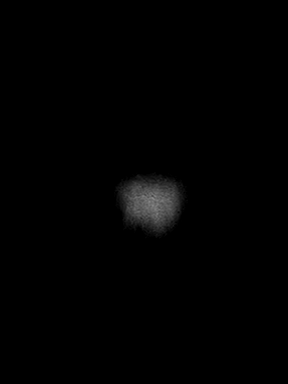

[43 of 48 positions shown; findings below may reference images not displayed]

FINDINGS: MRI HEAD FINDINGS

Brain: There is no evidence of acute infarct, intracranial
hemorrhage, mass, midline shift, or extra-axial fluid collection.
The ventricles and sulci are normal. The brain is normal in signal.
No abnormal enhancement is identified.

Vascular: Major intracranial vascular flow voids are preserved.

Skull and upper cervical spine: Unremarkable bone marrow signal.

Other: None.

MRI ORBITS FINDINGS

Orbits: The globes appear grossly intact. No inflammatory changes
are seen in the orbital fat, and there is no orbital mass. The
extraocular muscles are symmetric and normal in size and
enhancement. The left superior ophthalmic vein is minimally larger
than the right, however this is favored to reflect normal anatomic
variation. There is no proptosis. The optic nerves are symmetric in
appearance without evidence of edema or abnormal enhancement.

Visualized sinuses: No significant inflammatory changes in the
paranasal sinuses or mastoid air cells.

Soft tissues: Scattered small nodules in both parotid glands
measuring up to 5 mm in short axis, nonspecific though may reflect
small intraparotid lymph nodes.

Limited intracranial: There is slight asymmetry of the cavernous
sinuses, left larger than right, without abnormal flow voids or the
degree of enlargement that would typically be expected to be seen
with an underlying carotid-cavernous fistula. The optic chiasm is
unremarkable.
IMPRESSION: 1. Minimal asymmetry of the left cavernous sinus and left superior
ophthalmic vein, felt to most likely reflect normal anatomic
variation given only minimal asymmetry and lack of other findings to
strongly suggest the presence of a carotid-cavernous fistula.
2. Otherwise unremarkable appearance of the brain.

## 2020-10-16 ENCOUNTER — Other Ambulatory Visit: Payer: Self-pay

## 2020-10-16 ENCOUNTER — Emergency Department
Admission: EM | Admit: 2020-10-16 | Discharge: 2020-10-16 | Discharge status: Against Medical Advice | Payer: BLUE CROSS/BLUE SHIELD

## 2020-10-16 ENCOUNTER — Emergency Department (HOSPITAL_COMMUNITY)
Admission: EM | Admit: 2020-10-16 | Discharge: 2020-10-16 | Disposition: A | Discharge status: Home / Self Care | Payer: BLUE CROSS/BLUE SHIELD | Attending: Emergency Medicine | Admitting: Emergency Medicine

## 2020-10-16 ENCOUNTER — Encounter (HOSPITAL_COMMUNITY): Payer: Self-pay | Admitting: Emergency Medicine

## 2020-10-16 DIAGNOSIS — E871 Hypo-osmolality and hyponatremia: Secondary | ICD-10-CM | POA: Insufficient documentation

## 2020-10-16 DIAGNOSIS — J039 Acute tonsillitis, unspecified: Secondary | ICD-10-CM | POA: Diagnosis not present

## 2020-10-16 DIAGNOSIS — Z91040 Latex allergy status: Secondary | ICD-10-CM | POA: Diagnosis not present

## 2020-10-16 DIAGNOSIS — R509 Fever, unspecified: Secondary | ICD-10-CM

## 2020-10-16 LAB — COMPREHENSIVE METABOLIC PANEL
ALT: 15 U/L (ref 0–44)
AST: 17 U/L (ref 15–41)
Albumin: 3.8 g/dL (ref 3.5–5.0)
Alkaline Phosphatase: 65 U/L (ref 38–126)
Anion gap: 10 (ref 5–15)
BUN: 10 mg/dL (ref 6–20)
CO2: 21 mmol/L — ABNORMAL LOW (ref 22–32)
Calcium: 8.5 mg/dL — ABNORMAL LOW (ref 8.9–10.3)
Chloride: 102 mmol/L (ref 98–111)
Creatinine, Ser: 0.72 mg/dL (ref 0.44–1.00)
GFR, Estimated: >60 mL/min (ref >60)
Glucose, Bld: 130 mg/dL — ABNORMAL HIGH (ref 70–99)
Potassium: 3.7 mmol/L (ref 3.5–5.1)
Sodium: 133 mmol/L — ABNORMAL LOW (ref 135–145)
Total Bilirubin: 0.5 mg/dL (ref 0.3–1.2)
Total Protein: 7.4 g/dL (ref 6.5–8.1)

## 2020-10-16 LAB — CBC WITH DIFFERENTIAL/PLATELET
Abs Immature Granulocytes: 0.05 10*3/uL (ref 0.00–0.07)
Basophils Absolute: 0 10*3/uL (ref 0.0–0.1)
Basophils Relative: 0 %
Eosinophils Absolute: 0 10*3/uL (ref 0.0–0.5)
Eosinophils Relative: 0 %
HCT: 39.6 % (ref 36.0–46.0)
Hemoglobin: 13 g/dL (ref 12.0–15.0)
Immature Granulocytes: 1 %
Lymphocytes Relative: 15 %
Lymphs Abs: 1.6 10*3/uL (ref 0.7–4.0)
MCH: 28.8 pg (ref 26.0–34.0)
MCHC: 32.8 g/dL (ref 30.0–36.0)
MCV: 87.8 fL (ref 80.0–100.0)
Monocytes Absolute: 1 10*3/uL (ref 0.1–1.0)
Monocytes Relative: 10 %
Neutro Abs: 7.8 10*3/uL — ABNORMAL HIGH (ref 1.7–7.7)
Neutrophils Relative %: 74 %
Platelets: 205 10*3/uL (ref 150–400)
RBC: 4.51 MIL/uL (ref 3.87–5.11)
RDW: 13.2 % (ref 11.5–15.5)
WBC: 10.6 10*3/uL — ABNORMAL HIGH (ref 4.0–10.5)
nRBC: 0 % (ref 0.0–0.2)

## 2020-10-16 LAB — MONONUCLEOSIS SCREEN: Mono Screen: NEGATIVE

## 2020-10-16 LAB — GROUP A STREP BY PCR: Group A Strep by PCR: NOT DETECTED

## 2020-10-16 MED ORDER — DEXAMETHASONE 4 MG PO TABS
10.0000 mg | ORAL_TABLET | Freq: Once | ORAL | Status: AC
  Administered 2020-10-16: 10 mg via ORAL
  Filled 2020-10-16: qty 3

## 2020-10-16 NOTE — ED Triage Notes (Signed)
Pt c/o fever for past few days. States she was recently dx with pink eye and treated. Denies any sick exposures.

## 2020-10-16 NOTE — ED Provider Notes (Signed)
[facility] EMERGENCY DEPARTMENT Provider Note   CSN: [id number] Arrival date & time: 10/16/20  0244   History Chief Complaint  Patient presents with  . Fever    Kim Sandoval is a 20 y.o. female.  The history is provided by the patient.  Fever She has had fever for the last 2 days.  Fever has been high as 103 degrees.  She denies chills or sweats.  There has been a sore throat but no rhinorrhea or cough.  There has been no vomiting or diarrhea.  She denies arthralgias or myalgias.  She denies any sick contacts.  Acetaminophen has been effective for fever control.  She had strep several years ago and states that this feels different.  She also states that she had infectious mononucleosis about 6 months ago, and this feels different.  Past Medical History:  Diagnosis Date  . Ischemic optic neuropathy 2021    Patient Active Problem List   Diagnosis Date Noted  . 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 NO PAST SURGERIES       OB History    Gravida  0   Para  0   Term  0   Preterm  0   AB  0   Living  0     SAB  0   IAB  0   Ectopic  0   Multiple  0   Live Births  0           Family History  Problem Relation Age of Onset  . Asthma Brother     Social History   Tobacco Use  . Smoking status: Never Smoker  . Smokeless tobacco: Never Used  Vaping Use  . Vaping Use: Never used  Substance Use Topics  . Alcohol use: Yes    Comment: occ  . Drug use: Never    Home Medications Prior to Admission medications   Medication Sig Start Date End Date Taking? Authorizing Provider  norelgestromin-ethinyl estradiol (ORTHO EVRA) 150-35 MCG/24HR transdermal patch Place 1 patch onto the skin once a week. Patient not taking: Reported on 05/02/2020 04/09/20   Tresea Mall, CNM    Allergies     Latex  Review of Systems   Review of Systems  Constitutional: Positive for fever.  All other systems reviewed and are negative.   Physical Exam Updated Vital Signs BP 132/73 (BP Location: Left Arm)   Pulse 98   Temp 98.8 F (37.1 C) (Oral)   Resp 15   Ht 5\' 7"  (1.702 m)   Wt 70.3 kg   SpO2 94%   BMI 24.28 kg/m   Physical Exam Vitals and nursing note reviewed.   20 year old female, resting comfortably and in no acute distress. Vital signs are normal. Oxygen saturation is 94%, which is normal. Head is normocephalic and atraumatic. PERRLA, EOMI. Oropharynx shows tonsillar erythema and hypertrophy with exudate present. Neck is nontender and supple without adenopathy or JVD. Back is nontender and there is no CVA tenderness. Lungs are clear without rales, wheezes, or rhonchi. Chest is nontender. Heart has regular rate and rhythm without murmur. Abdomen is soft, flat, nontender without masses or hepatosplenomegaly and peristalsis is normoactive. Extremities have no cyanosis or edema, full range of motion is present. Skin is warm and dry without rash. Neurologic: Mental status is normal, cranial nerves are intact,  there are no motor or sensory deficits.  ED Results / Procedures / Treatments   Labs (all labs ordered are listed, but only abnormal results are displayed) Labs Reviewed  COMPREHENSIVE METABOLIC PANEL - Abnormal; Notable for the following components:      Result Value   Sodium 133 (*)    CO2 21 (*)    Glucose, Bld 130 (*)    Calcium 8.5 (*)    All other components within normal limits  CBC WITH DIFFERENTIAL/PLATELET - Abnormal; Notable for the following components:   WBC 10.6 (*)    Neutro Abs 7.8 (*)    All other components within normal limits  GROUP A STREP BY PCR  MONONUCLEOSIS SCREEN   Procedures Procedures   Medications Ordered in ED Medications  dexamethasone (DECADRON) tablet 10 mg (has no administration in time range)    ED Course  I have  reviewed the triage vital signs and the nursing notes.  Pertinent lab results that were available during my care of the patient were reviewed by me and considered in my medical decision making (see chart for details).  MDM Rules/Calculators/A&P Tonsillitis.  Strep PCR is obtained and is negative.  Will check CBC and mono screen.  Old records are reviewed, and I do not have any records of a positive mono screen.  Patient states that the test was done at [facility].  Mono test is negative.  Metabolic panel shows mild hyponatremia which is not felt to be clinically significant.  WBC is minimally elevated but without any report of atypical lymphocytes.  She is given a dose of dexamethasone and discharged with instructions to continue symptomatic treatment.  Return precautions discussed.  Final Clinical Impression(s) / ED Diagnoses Final diagnoses:  Tonsillitis  Fever, unspecified fever cause  Hyponatremia    Rx / DC Orders ED Discharge Orders    None       Dione Booze, MD 10/16/20 [phone]

## 2020-10-16 NOTE — Discharge Instructions (Signed)
Drink plenty of fluids.  Take acetaminophen and/or ibuprofen as needed for fever or pain.  Use lozenges and throat sprays as needed.  If your symptoms are getting worse, especially if you develop any difficulty breathing or swallowing, return to the emergency department.

## 2020-11-01 ENCOUNTER — Encounter: Payer: Self-pay | Admitting: Advanced Practice Midwife

## 2020-11-01 ENCOUNTER — Other Ambulatory Visit: Payer: Self-pay

## 2020-11-01 ENCOUNTER — Ambulatory Visit: Payer: BLUE CROSS/BLUE SHIELD | Admitting: Obstetrics

## 2020-11-01 ENCOUNTER — Ambulatory Visit: Payer: BLUE CROSS/BLUE SHIELD | Admitting: Advanced Practice Midwife

## 2020-11-01 ENCOUNTER — Other Ambulatory Visit (HOSPITAL_COMMUNITY)
Admission: RE | Admit: 2020-11-01 | Discharge: 2020-11-01 | Disposition: A | Discharge status: Home / Self Care | Payer: BLUE CROSS/BLUE SHIELD | Source: Ambulatory Visit | Attending: Advanced Practice Midwife | Admitting: Advanced Practice Midwife

## 2020-11-01 VITALS — BP 110/70 | Ht 68.0 in | Wt 160.0 lb

## 2020-11-01 DIAGNOSIS — N898 Other specified noninflammatory disorders of vagina: Secondary | ICD-10-CM | POA: Insufficient documentation

## 2020-11-01 DIAGNOSIS — Z113 Encounter for screening for infections with a predominantly sexual mode of transmission: Secondary | ICD-10-CM | POA: Insufficient documentation

## 2020-11-01 DIAGNOSIS — N926 Irregular menstruation, unspecified: Secondary | ICD-10-CM | POA: Diagnosis not present

## 2020-11-01 LAB — POCT URINE PREGNANCY: Preg Test, Ur: NEGATIVE

## 2020-11-01 NOTE — Progress Notes (Addendum)
Patient ID: Kim Sandoval, female   DOB: 03/22/2001, 20 y.o.   MRN: [id number]  Reason for Consult: Vaginal Discharge    Subjective:  HPI:  Kim Sandoval is a 20 y.o. female being seen for complaint of discharge and odor since Paragard IUD placement in October of last year. She describes the discharge as like "snot"- thick and sometimes yellow. The odor is fishy. She also mentions that her period is now 2 weeks late. She denies an increase in stress or any other major life changes. She did have pink eye mid march and was using steroid eye drops. LMP was early March. She would like her IUD strings checked along with vaginitis/STD testing.  Past Medical History:  Diagnosis Date  . Ischemic optic neuropathy 2021   Family History  Problem Relation Age of Onset  . Asthma Brother    Past Surgical History:  Procedure Laterality Date  . NO PAST SURGERIES      Short Social History:  Social History   Tobacco Use  . Smoking status: Never Smoker  . Smokeless tobacco: Never Used  Substance Use Topics  . Alcohol use: Yes    Comment: occ    Allergies  Allergen Reactions  . Latex Rash    No current outpatient medications on file.   Current Facility-Administered Medications  Medication Dose Route Frequency Provider Last Rate Last Admin  . paragard intrauterine copper IUD   Intrauterine Once Copland, Alicia B, PA-C        Review of Systems  Constitutional: Negative for chills and fever.  HENT: Negative for congestion, ear discharge, ear pain, hearing loss, sinus pain and sore throat.   Eyes: Negative for blurred vision and double vision.  Respiratory: Negative for cough, shortness of breath and wheezing.   Cardiovascular: Negative for chest pain, palpitations and leg swelling.  Gastrointestinal: Negative for abdominal pain, blood in stool, constipation, diarrhea, heartburn, melena, nausea and vomiting.  Genitourinary: Negative for dysuria, flank pain, frequency, hematuria and  urgency.       Positive for vaginal discharge and odor. Positive for pain with intercourse with deeper penetration.  Musculoskeletal: Negative for back pain, joint pain and myalgias.  Skin: Negative for itching and rash.  Neurological: Negative for dizziness, tingling, tremors, sensory change, speech change, focal weakness, seizures, loss of consciousness, weakness and headaches.  Endo/Heme/Allergies: Negative for environmental allergies. Does not bruise/bleed easily.  Psychiatric/Behavioral: Negative for depression, hallucinations, memory loss, substance abuse and suicidal ideas. The patient is not nervous/anxious and does not have insomnia.         Objective:  Objective   Vitals:   11/01/20 1135  BP: 110/70  Weight: 160 lb (72.6 kg)  Height: 5\' 8"  (1.727 m)   Body mass index is 24.33 kg/m.  Constitutional: Well nourished, well developed female in no acute distress.  HEENT: normal Skin: Warm and dry.  Extremity: no edema  Respiratory:  Normal respiratory effort Neuro: DTRs 2+, Cranial nerves grossly intact Psych: Alert and Oriented x3. No memory deficits. Normal mood and affect.  MS: normal gait, normal bilateral lower extremity ROM/strength/stability.  Pelvic exam:  is not limited by body habitus EGBUS: within normal limits Vagina: within normal limits and with normal mucosa, scant thin discharge Cervix: strings visualized 3 cm, cervical mucous present with pink tinge   Data: UPT negative  Assessment/Plan:     20 y.o. G0 P0 with possible vaginitis vs normal vaginal discharge, IUD in place, negative pregnancy test today  Aptima: vaginitis/STDs Follow up  as needed Update to visit: labs are all normal   Tresea Mall CNM [facility] 11/01/2020, 1:46 PM

## 2020-11-04 LAB — CERVICOVAGINAL ANCILLARY ONLY
Bacterial Vaginitis (gardnerella): NEGATIVE
Candida Glabrata: NEGATIVE
Candida Vaginitis: NEGATIVE
Chlamydia: NEGATIVE
Comment: NEGATIVE
Comment: NEGATIVE
Comment: NEGATIVE
Comment: NEGATIVE
Comment: NEGATIVE
Comment: NORMAL
Neisseria Gonorrhea: NEGATIVE
Trichomonas: NEGATIVE

## 2020-11-06 ENCOUNTER — Ambulatory Visit: Payer: BLUE CROSS/BLUE SHIELD | Admitting: Obstetrics

## 2021-05-21 NOTE — Telephone Encounter (Signed)
Paragard rcvd/charged 05/02/20
